# Patient Record
Sex: Male | Born: 1985 | Race: White | Hispanic: No | Marital: Single | Smoking: Never smoker
Health system: Southern US, Community
[De-identification: ages and names within clinical notes are randomized; demographics above are authoritative.]

---

## 2006-09-23 ENCOUNTER — Emergency Department (HOSPITAL_COMMUNITY): Admission: EM | Admit: 2006-09-23 | Discharge: 2006-09-23 | Payer: Self-pay | Admitting: Emergency Medicine

## 2013-11-28 ENCOUNTER — Emergency Department (HOSPITAL_COMMUNITY)
Admission: EM | Admit: 2013-11-28 | Discharge: 2013-11-29 | Discharge status: Against Medical Advice | Payer: BC Managed Care – PPO

## 2021-08-03 ENCOUNTER — Other Ambulatory Visit: Payer: Self-pay

## 2021-08-03 ENCOUNTER — Ambulatory Visit
Admission: EM | Admit: 2021-08-03 | Discharge: 2021-08-03 | Disposition: A | Discharge status: Home / Self Care | Payer: Self-pay | Attending: Internal Medicine | Admitting: Internal Medicine

## 2021-08-03 DIAGNOSIS — L255 Unspecified contact dermatitis due to plants, except food: Secondary | ICD-10-CM

## 2021-08-03 MED ORDER — PREDNISONE 20 MG PO TABS: 20.0000 mg | ORAL_TABLET | Freq: Every day | ORAL | 0 refills | Status: AC

## 2021-08-03 MED ORDER — HYDROXYZINE HCL 50 MG PO TABS: 50.0000 mg | ORAL_TABLET | Freq: Three times a day (TID) | ORAL | 0 refills | Status: AC | PRN

## 2021-08-03 MED ORDER — CALAMINE EX LOTN: 1.0000 "application " | TOPICAL_LOTION | CUTANEOUS | 0 refills | Status: AC | PRN

## 2021-08-03 NOTE — Discharge Instructions (Addendum)
Please take medications as prescribed Apply calamine lotion to affected areas when you are home If you notice worsening redness, purulent drainage, fever or worsening pain please return to urgent care to be reevaluated as this may be a sign of infection.

## 2021-08-03 NOTE — ED Provider Notes (Signed)
[facility]    CSN: [id number] Arrival date & time: 08/03/21  8366      History   Chief Complaint Chief Complaint  Patient presents with   Rash    HPI Kenneth Short is a 35 y.o. male comes to urgent care with a 3-day history of pruritic rash over the upper extremities, right side of the face and left side of the chin.  Patient went camping in the woods with his family over the weekend.  He believes he remained to contact with poison oak.  He denies any blurry vision or light sensitivity.  No double vision.   HPI  History reviewed. No pertinent past medical history.  There are no problems to display for this patient.   History reviewed. No pertinent surgical history.     Home Medications    Prior to Admission medications   Medication Sig Start Date End Date Taking? Authorizing Provider  calamine lotion Apply 1 application topically as needed for itching. 08/03/21  Yes Mykenzi Vanzile, Britta Mccreedy, MD  hydrOXYzine (ATARAX/VISTARIL) 50 MG tablet Take 1 tablet (50 mg total) by mouth every 8 (eight) hours as needed. 08/03/21  Yes Kelley Knoth, Britta Mccreedy, MD  predniSONE (DELTASONE) 20 MG tablet Take 1 tablet (20 mg total) by mouth daily for 5 days. 08/03/21 08/08/21 Yes Erline Siddoway, Britta Mccreedy, MD    Family History History reviewed. No pertinent family history.  Social History Social History   Tobacco Use   Smoking status: Never   Smokeless tobacco: Never  Substance Use Topics   Alcohol use: Yes     Allergies   Patient has no known allergies.   Review of Systems Review of Systems  Gastrointestinal: Negative.   Musculoskeletal: Negative.   Skin:  Positive for rash. Negative for color change, pallor and wound.    Physical Exam Triage Vital Signs ED Triage Vitals  Enc Vitals Group     BP 08/03/21 0922 136/82     Pulse Rate 08/03/21 0922 68     Resp 08/03/21 0922 18     Temp 08/03/21 0922 97.8 F (36.6 C)     Temp src --      SpO2 08/03/21 0922 97 %     Weight  --      Height --      Head Circumference --      Peak Flow --      Pain Score 08/03/21 0919 0     Pain Loc --      Pain Edu? --      Excl. in GC? --    No data found.  Updated Vital Signs BP 136/82   Pulse 68   Temp 97.8 F (36.6 C)   Resp 18   SpO2 97%   Visual Acuity Right Eye Distance:   Left Eye Distance:   Bilateral Distance:    Right Eye Near:   Left Eye Near:    Bilateral Near:     Physical Exam Vitals and nursing note reviewed.  Constitutional:      General: He is not in acute distress.    Appearance: He is not ill-appearing.  Cardiovascular:     Rate and Rhythm: Normal rate and regular rhythm.  Skin:    General: Skin is warm.     Findings: Erythema and lesion present. No rash.     Comments: Papular vesicular rash over the forearms and the right side of the face.  Extraocular movements intact.  No conjunctival erythema.  Neurological:     Mental Status: He is alert.     UC Treatments / Results  Labs (all labs ordered are listed, but only abnormal results are displayed) Labs Reviewed - No data to display  EKG   Radiology No results found.  Procedures Procedures (including critical care time)  Medications Ordered in UC Medications - No data to display  Initial Impression / Assessment and Plan / UC Course  I have reviewed the triage vital signs and the nursing notes.  Pertinent labs & imaging results that were available during my care of the patient were reviewed by me and considered in my medical decision making (see chart for details).     1.  Rhus dermatitis: Prednisone 20 mg orally daily for 5 days Hydroxyzine 50 mg every 6 hours as needed for itching Calamine lotion application as needed Return precautions given. Final Clinical Impressions(s) / UC Diagnoses   Final diagnoses:  Rhus dermatitis     Discharge Instructions      Please take medications as prescribed Apply calamine lotion to affected areas when you are home If  you notice worsening redness, purulent drainage, fever or worsening pain please return to urgent care to be reevaluated as this may be a sign of infection.   ED Prescriptions     Medication Sig Dispense Auth. Provider   predniSONE (DELTASONE) 20 MG tablet Take 1 tablet (20 mg total) by mouth daily for 5 days. 5 tablet Jaselyn Nahm, Britta Mccreedy, MD   hydrOXYzine (ATARAX/VISTARIL) 50 MG tablet Take 1 tablet (50 mg total) by mouth every 8 (eight) hours as needed. 21 tablet Hartlyn Reigel, Britta Mccreedy, MD   calamine lotion Apply 1 application topically as needed for itching. 120 mL Leisha Trinkle, Britta Mccreedy, MD      PDMP not reviewed this encounter.   Merrilee Jansky, MD 08/03/21 1006

## 2021-08-03 NOTE — ED Triage Notes (Signed)
Pt presents with rash on arms and face that began the weekend, believes to be poison oak
